# Patient Record
Sex: Female | Born: 1980 | Race: White | Hispanic: No | Marital: Married
Health system: Western US, Academic
[De-identification: ages and names within clinical notes are randomized; demographics above are authoritative.]

## PROBLEM LIST (undated history)

## (undated) DIAGNOSIS — S069XAA Unspecified intracranial injury with loss of consciousness status unknown, initial encounter: Secondary | ICD-10-CM

## (undated) DIAGNOSIS — S069X9A Unspecified intracranial injury with loss of consciousness of unspecified duration, initial encounter: Secondary | ICD-10-CM

## (undated) HISTORY — PX: SHOULDER SURGERY: SHX246

## (undated) HISTORY — PX: LEG SURGERY: SHX1003

## (undated) HISTORY — PX: BACK SURGERY: SHX140

---

## 2016-09-24 ENCOUNTER — Emergency Department (HOSPITAL_COMMUNITY)
Admission: EM | Admit: 2016-09-24 | Discharge: 2016-09-24 | Disposition: A | Discharge status: Home / Self Care | Payer: Medicare Other | Attending: Emergency Medicine | Admitting: Emergency Medicine

## 2016-09-24 ENCOUNTER — Emergency Department (HOSPITAL_COMMUNITY): Payer: Medicare Other

## 2016-09-24 ENCOUNTER — Encounter (HOSPITAL_COMMUNITY): Payer: Self-pay | Admitting: Emergency Medicine

## 2016-09-24 DIAGNOSIS — F17200 Nicotine dependence, unspecified, uncomplicated: Secondary | ICD-10-CM | POA: Insufficient documentation

## 2016-09-24 DIAGNOSIS — S8991XS Unspecified injury of right lower leg, sequela: Secondary | ICD-10-CM | POA: Diagnosis present

## 2016-09-24 DIAGNOSIS — M25461 Effusion, right knee: Secondary | ICD-10-CM | POA: Diagnosis not present

## 2016-09-24 HISTORY — DX: Unspecified intracranial injury with loss of consciousness of unspecified duration, initial encounter: S06.9X9A

## 2016-09-24 HISTORY — DX: Unspecified intracranial injury with loss of consciousness status unknown, initial encounter: S06.9XAA

## 2016-09-24 MED ORDER — PREDNISONE 50 MG PO TABS
60.0000 mg | ORAL_TABLET | Freq: Once | ORAL | Status: AC
  Administered 2016-09-24: 60 mg via ORAL
  Filled 2016-09-24: qty 1

## 2016-09-24 MED ORDER — PREDNISONE 10 MG PO TABS
ORAL_TABLET | ORAL | 0 refills | Status: AC
Start: 2016-09-24 — End: ?
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
## 2016-09-29 ENCOUNTER — Emergency Department (HOSPITAL_COMMUNITY): Payer: Medicare Other

## 2016-09-29 ENCOUNTER — Emergency Department (HOSPITAL_COMMUNITY)
Admission: EM | Admit: 2016-09-29 | Discharge: 2016-09-29 | Disposition: A | Discharge status: Home / Self Care | Payer: Medicare Other | Attending: Emergency Medicine | Admitting: Emergency Medicine

## 2016-09-29 ENCOUNTER — Encounter (HOSPITAL_COMMUNITY): Payer: Self-pay | Admitting: Emergency Medicine

## 2016-09-29 DIAGNOSIS — W1789XA Other fall from one level to another, initial encounter: Secondary | ICD-10-CM | POA: Insufficient documentation

## 2016-09-29 DIAGNOSIS — Z23 Encounter for immunization: Secondary | ICD-10-CM | POA: Diagnosis not present

## 2016-09-29 DIAGNOSIS — Y92199 Unspecified place in other specified residential institution as the place of occurrence of the external cause: Secondary | ICD-10-CM | POA: Diagnosis not present

## 2016-09-29 DIAGNOSIS — Y999 Unspecified external cause status: Secondary | ICD-10-CM | POA: Insufficient documentation

## 2016-09-29 DIAGNOSIS — S069X9A Unspecified intracranial injury with loss of consciousness of unspecified duration, initial encounter: Secondary | ICD-10-CM | POA: Insufficient documentation

## 2016-09-29 DIAGNOSIS — Y939 Activity, unspecified: Secondary | ICD-10-CM | POA: Insufficient documentation

## 2016-09-29 DIAGNOSIS — S0101XA Laceration without foreign body of scalp, initial encounter: Secondary | ICD-10-CM | POA: Diagnosis not present

## 2016-09-29 DIAGNOSIS — W19XXXA Unspecified fall, initial encounter: Secondary | ICD-10-CM

## 2016-09-29 DIAGNOSIS — Y92009 Unspecified place in unspecified non-institutional (private) residence as the place of occurrence of the external cause: Secondary | ICD-10-CM

## 2016-09-29 MED ORDER — TETANUS-DIPHTH-ACELL PERTUSSIS 5-2.5-18.5 LF-MCG/0.5 IM SUSP
0.5000 mL | Freq: Once | INTRAMUSCULAR | Status: AC
  Administered 2016-09-29: 0.5 mL via INTRAMUSCULAR
  Filled 2016-09-29: qty 0.5

## 2016-09-29 NOTE — Discharge Instructions (Signed)
Staples need to be removed in 7-10 days. That can be done here, at an urgent care center, or at your doctor's office. °

## 2016-09-29 NOTE — ED Provider Notes (Addendum)
[facility] DE[facility] Provider Note   CSN: [id number] Arrival date & time: 09/29/16  0450     History   Chief Complaint Chief Complaint  Patient presents with  . Fall    HPI Janice Mason is a 36 y.o. female.  The history is provided by the patient and a caregiver.  Fall   She is a resident at [facility], which is a halfway house for drug and alcohol abuse. Tonight, she got out of bed to urinate and fell asleep on the commode and fell off of the commode striking her head. She was unconscious for an unknown period of time. She awoke and walked to wake up another resident. She does not know when her last tetanus immunization was. She denies nausea or vomiting. Caregiver states that she has poor balance at baseline, but she is currently at her baseline. He also states that her mental status is at her baseline. She denies other injury.  Past Medical History:  Diagnosis Date  . TBI (traumatic brain injury) (HCC)     There are no active problems to display for this patient.   Past Surgical History:  Procedure Laterality Date  . LEG SURGERY    . SHOULDER SURGERY      OB History    No data available       Home Medications    Prior to Admission medications   Medication Sig Start Date End Date Taking? Authorizing Provider  predniSONE (DELTASONE) 10 MG tablet Take 6 tablets day one, 5 tablets day two, 4 tablets day three, 3 tablets day four, 2 tablets day five, then 1 tablet day six 09/24/16   Burgess Amor, PA-C    Family History No family history on file.  Social History Social History  Substance Use Topics  . Smoking status: Current Every Day Smoker  . Smokeless tobacco: Never Used  . Alcohol use No     Allergies   Cubicin [daptomycin]   Review of Systems Review of Systems  All other systems reviewed and are negative.    Physical Exam Updated Vital Signs BP 121/85 (BP Location: Right Arm)   Pulse 91   Temp 97.9 F (36.6 C) (Oral)   Resp 18   Ht 5\' 4"   (1.626 m)   Wt 74.8 kg (165 lb)   LMP 09/29/2016   SpO2 99%   BMI 28.32 kg/m   Physical Exam  Nursing note and vitals reviewed.  36 year old female, resting comfortably and in no acute distress. Vital signs are normal. Oxygen saturation is 99%, which is normal. Head is normocephalic. Scalp laceration is noted in the frontal region. PERRLA, EOMI. Oropharynx is clear. Neck is nontender without adenopathy or JVD. Back is nontender and there is no CVA tenderness. Lungs are clear without rales, wheezes, or rhonchi. Chest is nontender. Heart has regular rate and rhythm without murmur. Abdomen is soft, flat, nontender without masses or hepatosplenomegaly and peristalsis is normoactive. Extremities have no cyanosis or edema, full range of motion is present. Skin is warm and dry without rash. Neurologic: Mental status is normal, cranial nerves are intact, there are no motor or sensory deficits.  ED Treatments / Results   Radiology Ct Head Wo Contrast  Result Date: 09/29/2016 CLINICAL DATA:  Fell asleep in bathroom and hit forehead on radiator, with frontal headache. Concern for cervical spine injury. Initial encounter. EXAM: CT HEAD WITHOUT CONTRAST CT CERVICAL SPINE WITHOUT CONTRAST TECHNIQUE: Multidetector CT imaging of the head and cervical spine was performed  following the standard protocol without intravenous contrast. Multiplanar CT image reconstructions of the cervical spine were also generated. COMPARISON:  None. FINDINGS: CT HEAD FINDINGS Brain: No evidence of acute infarction, hemorrhage, hydrocephalus, extra-axial collection or mass lesion/mass effect. Prominence of the ventricles and sulci reflects mild to moderate cortical volume loss. 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 The brainstem and fourth ventricle are within normal limits. The basal ganglia are unremarkable in appearance. No mass effect or midline shift is  seen. Vascular: No hyperdense vessel or unexpected calcification. Skull: There is no evidence of fracture; visualized osseous structures are unremarkable in appearance. Sinuses/Orbits: The visualized portions of the orbits are within normal limits. The paranasal sinuses and mastoid air cells are well-aerated. Other: No significant soft tissue abnormalities are seen. CT CERVICAL SPINE FINDINGS Alignment: Normal. Skull base and vertebrae: No acute fracture. No primary bone lesion or focal pathologic process. Postoperative change is noted along the left clavicle. Soft tissues and spinal canal: No prevertebral fluid or swelling. No visible canal hematoma. Disc levels: Intervertebral disc spaces are preserved. Scattered anterior and posterior disc osteophyte complexes are noted along the cervical spine. Upper chest: The visualized lung bases are clear. The thyroid gland is unremarkable. Other: No acute osseous abnormalities are identified. The visualized musculature is unremarkable in appearance. IMPRESSION: 1. No evidence of traumatic intracranial injury or fracture. 2. No evidence of fracture or subluxation along the cervical spine. 3. Mild to moderate cortical volume loss. 4. 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 5. Mild degenerative change along the cervical spine. Electronically Signed   By: Roanna RaiderJeffery  Chang M.D.   On: 09/29/2016 05:51   Ct Cervical Spine Wo Contrast  Result Date: 09/29/2016 CLINICAL DATA:  Fell asleep in bathroom and hit forehead on radiator, with frontal headache. Concern for cervical spine injury. Initial encounter. EXAM: CT HEAD WITHOUT CONTRAST CT CERVICAL SPINE WITHOUT CONTRAST TECHNIQUE: Multidetector CT imaging of the head and cervical spine was performed following the standard protocol without intravenous contrast. Multiplanar CT image reconstructions of the cervical spine were also generated. COMPARISON:  None. FINDINGS: CT  HEAD FINDINGS Brain: No evidence of acute infarction, hemorrhage, hydrocephalus, extra-axial collection or mass lesion/mass effect. Prominence of the ventricles and sulci reflects mild to moderate cortical volume loss. 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 The brainstem and fourth ventricle are within normal limits. The basal ganglia are unremarkable in appearance. No mass effect or midline shift is seen. Vascular: No hyperdense vessel or unexpected calcification. Skull: There is no evidence of fracture; visualized osseous structures are unremarkable in appearance. Sinuses/Orbits: The visualized portions of the orbits are within normal limits. The paranasal sinuses and mastoid air cells are well-aerated. Other: No significant soft tissue abnormalities are seen. CT CERVICAL SPINE FINDINGS Alignment: Normal. Skull base and vertebrae: No acute fracture. No primary bone lesion or focal pathologic process. Postoperative change is noted along the left clavicle. Soft tissues and spinal canal: No prevertebral fluid or swelling. No visible canal hematoma. Disc levels: Intervertebral disc spaces are preserved. Scattered anterior and posterior disc osteophyte complexes are noted along the cervical spine. Upper chest: The visualized lung bases are clear. The thyroid gland is unremarkable. Other: No acute osseous abnormalities are identified. The visualized musculature is unremarkable in appearance. IMPRESSION: 1. No evidence of traumatic intracranial injury or fracture. 2. No evidence of fracture or subluxation along the cervical spine. 3.  Mild to moderate cortical volume loss. 4. 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 5. Mild degenerative change along the cervical spine. Electronically Signed   By: Roanna Raider M.D.   On: 09/29/2016 05:51    Procedures Procedures (including critical care  time) LACERATION REPAIR Performed by: AVWUJ,[email or web address] Authorized by: YNWGN,FAOZH Consent: Verbal consent obtained. Risks and benefits: risks, benefits and alternatives were discussed Consent given by: patient Patient identity confirmed: provided demographic data Prepped and Draped in normal sterile fashion Wound explored  Laceration Location: Scalp  Laceration Length: 2 cm  No Foreign Bodies seen or palpated  Anesthesia: local infiltration  Local anesthetic: None  Skin closure: Close  Number of staples: 4  Technique: Surgical stapling   Patient tolerance: Patient tolerated the procedure well with no immediate complications.   Medications Ordered in ED Medications  Tdap (BOOSTRIX) injection 0.5 mL (not administered)     Initial Impression / Assessment and Plan / ED Course  I have reviewed the triage vital signs and the nursing notes.  Pertinent imaging results that were available during my care of the patient were reviewed by me and considered in my medical decision making (see chart for details).  Fall with head injury and loss of consciousness of unknown duration. She will need CT of head and cervical spine. Old records are reviewed, and she was seen 5 days ago for a knee effusion. Tdap booster is given.   Laceration is closed with surgical staples.  Final Clinical Impressions(s) / ED Diagnoses   Final diagnoses:  Fall at home, initial encounter  Laceration of scalp without complication, initial encounter    New Prescriptions New Prescriptions   No medications on file     Dione Booze, MD 09/29/16 0865    Dione Booze, MD 09/29/16 [id number]

## 2016-09-29 NOTE — ED Triage Notes (Addendum)
Pt is at [facility] and she was sitting on toilet and fell asleep where she then fell and hit forehead on a radiator. Pt states she had LOC but unsure of how long. Pt has non-bleeding laceration to scalp line.

## 2016-09-29 NOTE — ED Notes (Signed)
Pt has been discharged but caregiver that accompanied pt is not at bedside. Pt does not know phone number to call. Attempted to contact facility ([facility]) but only received a recording stating that they open at 8:30.

## 2016-09-30 ENCOUNTER — Encounter: Payer: Self-pay | Admitting: Orthopaedic Surgery

## 2016-09-30 ENCOUNTER — Ambulatory Visit (INDEPENDENT_AMBULATORY_CARE_PROVIDER_SITE_OTHER): Payer: Medicare Other | Admitting: Orthopaedic Surgery

## 2016-09-30 VITALS — BP 118/85 | HR 100 | Temp 98.1°F | Ht 64.0 in | Wt 160.0 lb

## 2016-09-30 DIAGNOSIS — G8929 Other chronic pain: Secondary | ICD-10-CM | POA: Diagnosis not present

## 2016-09-30 DIAGNOSIS — F17210 Nicotine dependence, cigarettes, uncomplicated: Secondary | ICD-10-CM | POA: Diagnosis not present

## 2016-09-30 DIAGNOSIS — M25561 Pain in right knee: Secondary | ICD-10-CM | POA: Diagnosis not present

## 2016-09-30 NOTE — Patient Instructions (Signed)
Steps to Quit Smoking Smoking tobacco can be bad for your health. It can also affect almost every organ in your body. Smoking puts you and people around you at risk for many serious Janice Mason-lasting (chronic) diseases. Quitting smoking is hard, but it is one of the best things that you can do for your health. It is never too late to quit. What are the benefits of quitting smoking? When you quit smoking, you lower your risk for getting serious diseases and conditions. They can include:  Lung cancer or lung disease.  Heart disease.  Stroke.  Heart attack.  Not being able to have children (infertility).  Weak bones (osteoporosis) and broken bones (fractures).  If you have coughing, wheezing, and shortness of breath, those symptoms may get better when you quit. You may also get sick less often. If you are pregnant, quitting smoking can help to lower your chances of having a baby of low birth weight. What can I do to help me quit smoking? Talk with your doctor about what can help you quit smoking. Some things you can do (strategies) include:  Quitting smoking totally, instead of slowly cutting back how much you smoke over a period of time.  Going to in-person counseling. You are more likely to quit if you go to many counseling sessions.  Using resources and support systems, such as: ? Online chats with a counselor. ? Phone quitlines. ? Printed self-help materials. ? Support groups or group counseling. ? Text messaging programs. ? Mobile phone apps or applications.  Taking medicines. Some of these medicines may have nicotine in them. If you are pregnant or breastfeeding, do not take any medicines to quit smoking unless your doctor says it is okay. Talk with your doctor about counseling or other things that can help you.  Talk with your doctor about using more than one strategy at the same time, such as taking medicines while you are also going to in-person counseling. This can help make  quitting easier. What things can I do to make it easier to quit? Quitting smoking might feel very hard at first, but there is a lot that you can do to make it easier. Take these steps:  Talk to your family and friends. Ask them to support and encourage you.  Call phone quitlines, reach out to support groups, or work with a counselor.  Ask people who smoke to not smoke around you.  Avoid places that make you want (trigger) to smoke, such as: ? Bars. ? Parties. ? Smoke-break areas at work.  Spend time with people who do not smoke.  Lower the stress in your life. Stress can make you want to smoke. Try these things to help your stress: ? Getting regular exercise. ? Deep-breathing exercises. ? Yoga. ? Meditating. ? Doing a body scan. To do this, close your eyes, focus on one area of your body at a time from head to toe, and notice which parts of your body are tense. Try to relax the muscles in those areas.  Download or buy apps on your mobile phone or tablet that can help you stick to your quit plan. There are many free apps, such as QuitGuide from the CDC (Centers for Disease Control and Prevention). You can find more support from smokefree.gov and other websites.  This information is not intended to replace advice given to you by your health care provider. Make sure you discuss any questions you have with your health care provider. Document Released: 01/31/2009 Document   Revised: 12/03/2015 Document Reviewed: 08/21/2014 Elsevier Interactive Patient Education  2018 Elsevier Inc.  
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
## 2016-10-01 ENCOUNTER — Emergency Department (HOSPITAL_COMMUNITY)
Admission: EM | Admit: 2016-10-01 | Discharge: 2016-10-01 | Disposition: A | Discharge status: Home / Self Care | Payer: Medicare Other | Attending: Emergency Medicine | Admitting: Emergency Medicine

## 2016-10-01 ENCOUNTER — Telehealth: Payer: Self-pay | Admitting: Orthopaedic Surgery

## 2016-10-01 ENCOUNTER — Encounter (HOSPITAL_COMMUNITY): Payer: Self-pay | Admitting: Emergency Medicine

## 2016-10-01 DIAGNOSIS — Z4801 Encounter for change or removal of surgical wound dressing: Secondary | ICD-10-CM | POA: Insufficient documentation

## 2016-10-01 DIAGNOSIS — Z5189 Encounter for other specified aftercare: Secondary | ICD-10-CM

## 2016-10-01 DIAGNOSIS — F17200 Nicotine dependence, unspecified, uncomplicated: Secondary | ICD-10-CM | POA: Diagnosis not present
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
## 2016-10-01 NOTE — ED Triage Notes (Signed)
Remove staples from head

## 2016-10-01 NOTE — Telephone Encounter (Signed)
Patient called stating that she was canceling the MRI that is scheduled for 6/25, [place] [facility] [place]. I told her that I would cancel her return appointment with us.

## 2016-10-12 ENCOUNTER — Ambulatory Visit (HOSPITAL_COMMUNITY): Payer: No Typology Code available for payment source

## 2016-10-14 ENCOUNTER — Ambulatory Visit: Payer: Medicare Other | Admitting: Orthopaedic Surgery
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
## 2019-12-30 LAB — COMPLETE BLOOD COUNT WITH DIFF
Abs Basophils: 0.06 10*9/L (ref 0.00–0.10)
Abs Eosinophils: 0.24 10*9/L (ref 0.00–0.40)
Abs Imm Granulocytes: 0.03 10*9/L (ref <0.10)
Abs Lymphocytes: 1.93 10*9/L (ref 1.00–3.40)
Abs Monocytes: 0.49 10*9/L (ref 0.20–0.80)
Abs Neutrophils: 5 10*9/L (ref 1.80–6.80)
Hematocrit: 42.3 % (ref 36.0–46.0)
Hemoglobin: 13.8 g/dL (ref 12.0–15.5)
MCH: 29.9 pg (ref 26.0–34.0)
MCHC: 32.6 g/dL (ref 31.0–36.0)
MCV: 92 fL (ref 80–100)
Platelet Count: 315 10*9/L (ref 140–450)
RBC Count: 4.61 10*12/L (ref 4.00–5.20)
WBC Count: 7.8 10*9/L (ref 3.4–10.0)

## 2019-12-30 LAB — THYROID STIMULATING HORMONE: Thyroid Stimulating Hormone: 0.12 mIU/L — ABNORMAL LOW (ref 0.45–4.12)

## 2019-12-30 LAB — DRUGS OF ABUSE SCREEN, RAPID
Amphetamine Screen, Urine: NEGATIVE
Barbiturates Screen, Urine: NEGATIVE
Benzodiazepines Screen, Urine: NEGATIVE
Cocaine Screen, Urine: NEGATIVE
Opiates Screen, Urine: NEGATIVE
Tetrahydrocannabinol Screen, U: NEGATIVE

## 2019-12-30 LAB — BASIC METABOLIC PANEL (NA, K,
Anion Gap: 10 (ref 4–14)
Calcium, total, Serum / Plasma: 9.5 mg/dL (ref 8.4–10.5)
Carbon Dioxide, Total: 22 mmol/L (ref 22–29)
Chloride, Serum / Plasma: 109 mmol/L (ref 101–110)
Creatinine: 0.85 mg/dL (ref 0.55–1.02)
Glucose, non-fasting: 112 mg/dL (ref 70–199)
Potassium, Serum / Plasma: 4.1 mmol/L (ref 3.5–5.0)
Sodium, Serum / Plasma: 141 mmol/L (ref 135–145)
Urea Nitrogen, Serum / Plasma: 11 mg/dL (ref 7–25)
eGFR - high estimate: 99 mL/min (ref >59)
eGFR - low estimate: 85 mL/min (ref >59)

## 2019-12-30 LAB — HCG PREGNANCY, URINE, >= 18 YE: HCG Pregnancy, Urine, >= 18 ye: NEGATIVE

## 2019-12-30 NOTE — Interdisciplinary (Signed)
EDSW NOTE    DATA: 39 yr old female with h/o BPD, PTSD, and depression who self presents to the ED for SI.     ASSESSMENT: Psych assessed pt and she does not meet criteria for a hold. Psych discussed option of [facility] with potential linkage to [facility] or [facility]. Pt is aware there is no guarantee of either option being arranged from [facility]. Pt is in agreement with this plan and contracts for safety.     Referred to [facility] and accepted. Per [facility], they will attempt to assist with linkage to [facility]. Pt informed of this plan and is in agreement. Reports feeling hopeful about possibly returning to [facility].     Clinical information faxed to [facility]. ED MD will reassess and place DC orders. RN aware of plan. Taxi voucher provided.     PLAN:  1. Discharge to [facility].   2. Taxi voucher provided.     Freida Busman, LCSW  [phone]
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
## 2019-12-30 NOTE — Unmapped (Signed)
You were seen in the emergency department for thoughts of hurting yourself. We performed a full evaluation, including blood and urine tests. You were seen by our psychiatrists and set up with discharge to [facility].     You have evidence of low TSH (thyroid) level but normal T3 level; please follow up with your doctor for repeat testing.    Please follow up with your primary care provider.    Return to the emergency department if you have recurrent thoughts of hurting yourself. Also return if you develop a fever, severe chest pain, difficulty breathing, or any other symptoms concerning to you.
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
## 2019-12-31 LAB — COVID-19 RNA, RT-PCR/NUCLEIC A: COVID-19 RNA, RT-PCR/Nucleic A: NOT DETECTED

## 2019-12-31 LAB — FREE T3, ADULT: Free T3, Adult: 4.2 pmol/L (ref 2.6–5.7)

## 2020-01-01 LAB — POCT COVID-19 RNA, QUALITATIVE: POCT COVID-19 RNA, Qualitative: NOT DETECTED

## 2020-02-02 LAB — ECG 12-LEAD
Atrial Rate: 64 {beats}/min
Calculated P Axis: 6 degrees
Calculated R Axis: 49 degrees
Calculated T Axis: 19 degrees
P-R Interval: 136 ms
QRS Duration: 82 ms
QT Interval: 426 ms
QTcb: 439 ms
Ventricular Rate: 64 {beats}/min
